# Patient Record
Sex: Female | Born: 2000
Health system: Southern US, Community
[De-identification: ages and names within clinical notes are randomized; demographics above are authoritative.]

---
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
## 2020-08-02 IMAGING — DX DG FOOT COMPLETE 3+V*L*
3 series · 3 of 3 positions shown · non-contrast
Comparison: None.

CLINICAL DATA: Fall playing basketball with left foot pain

EXAM:
LEFT FOOT - COMPLETE 3+ VIEW

[foot ap]
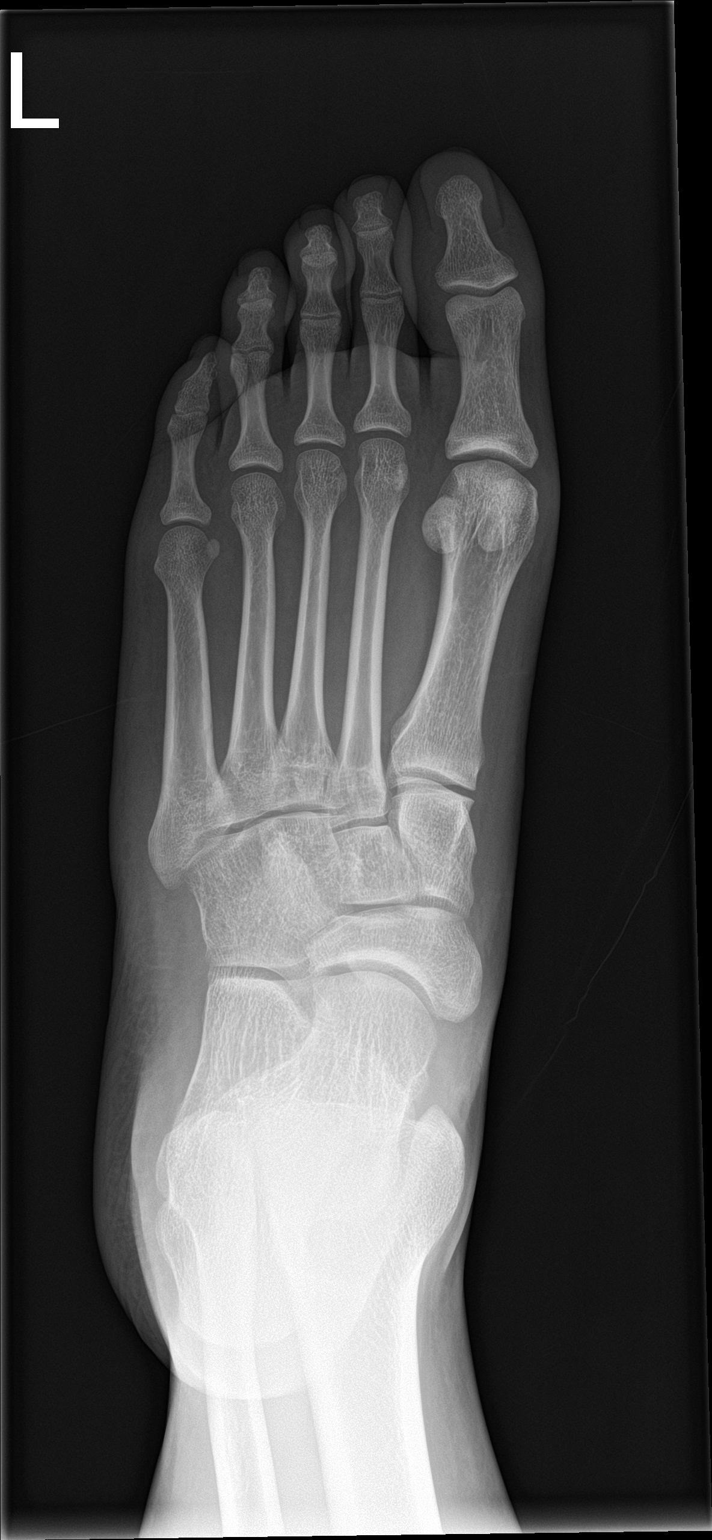

[foot obl]
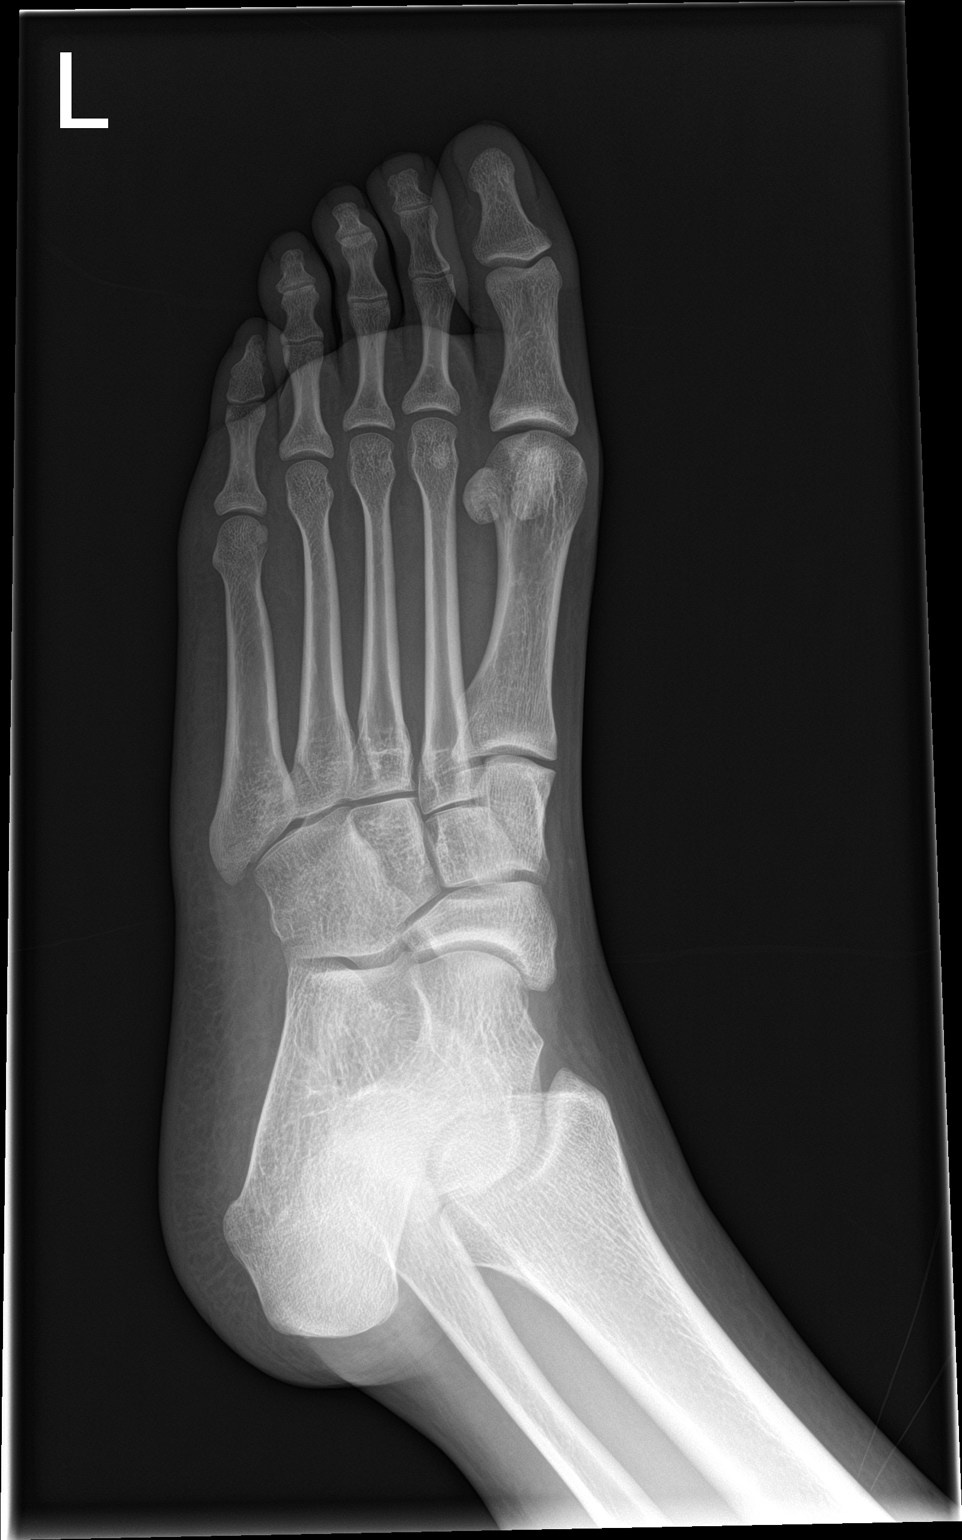

[foot lat]
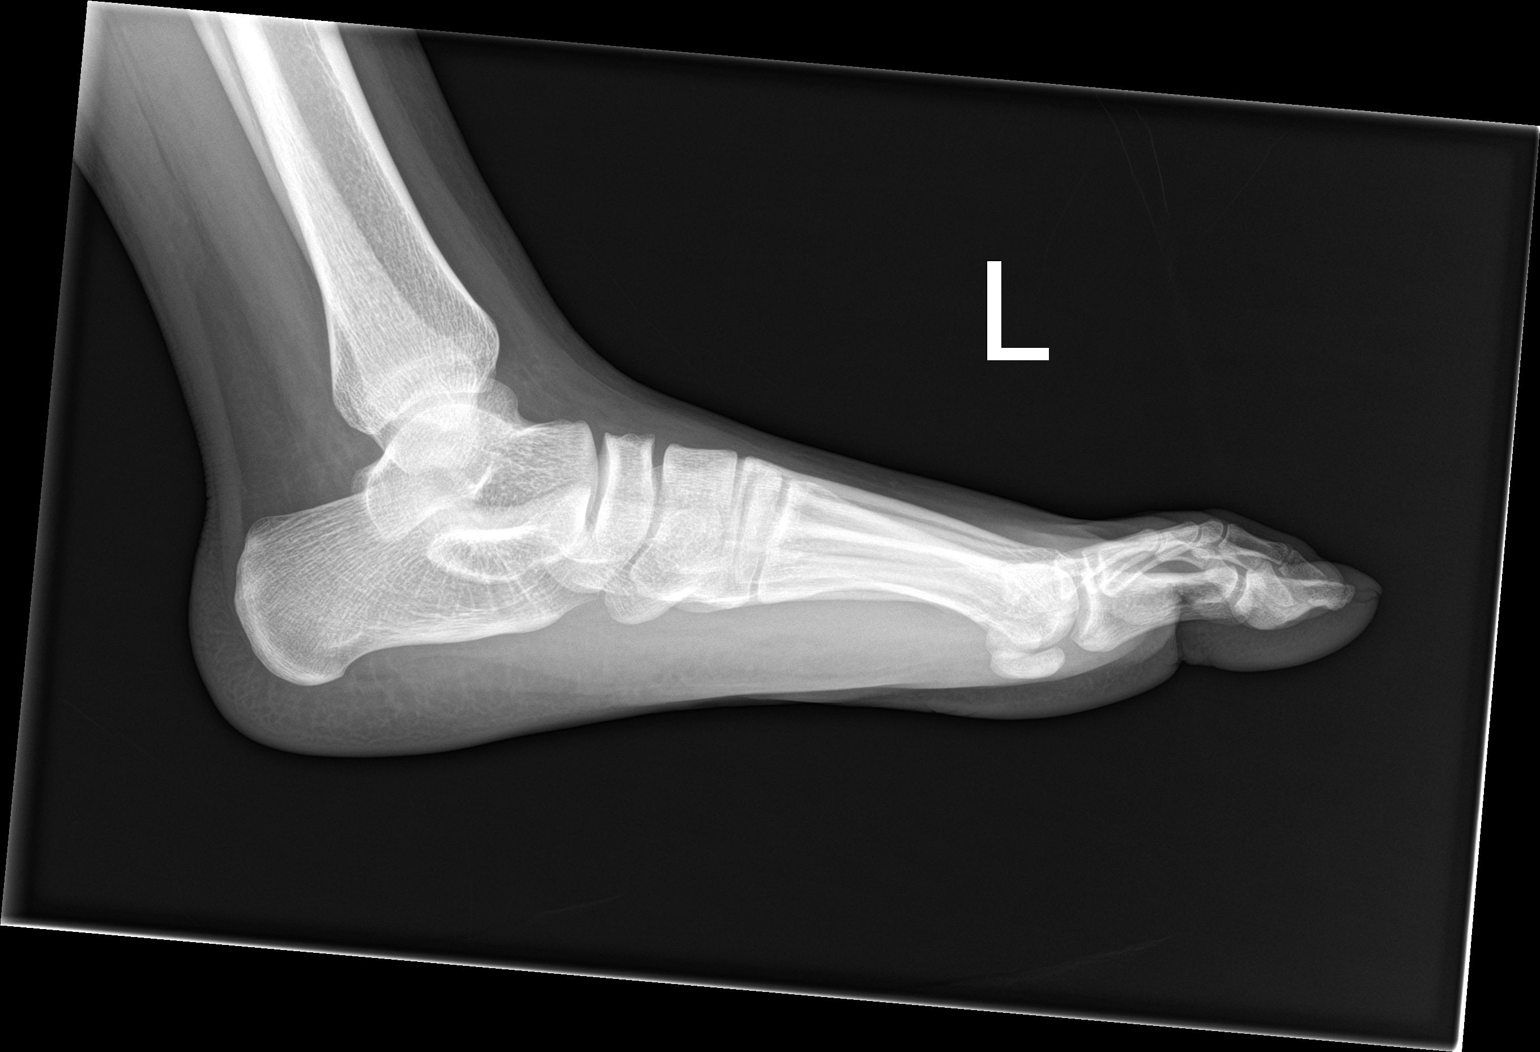

[3 of 3 positions shown; findings below may reference images not displayed]

FINDINGS: Subtle lucency through the base of the fifth metatarsal may reflect
a nondisplaced fracture with minimal overlying soft tissue swelling.
No other acute fracture or traumatic malalignment is evident. There
is an appearance of pes planus though mid and hindfoot alignment is
incompletely assessed on nonweightbearing radiographs. Mild soft
tissue swelling over the midfoot. No subcutaneous gas or foreign
body
IMPRESSION: Subtle lucency through the base of the fifth metatarsal may reflect
a nondisplaced fracture with minimal overlying soft tissue swelling.
Correlate for point tenderness.

No other acute fracture or traumatic malalignment.

Appearance of pes planus though mid and hindfoot alignment is
incompletely assessed on nonweightbearing views.

## 2022-10-03 ENCOUNTER — Emergency Department (HOSPITAL_COMMUNITY)
Admission: EM | Admit: 2022-10-03 | Discharge: 2022-10-04 | Discharge status: Against Medical Advice | Payer: No Typology Code available for payment source | Attending: Emergency Medicine | Admitting: Emergency Medicine

## 2022-10-03 ENCOUNTER — Other Ambulatory Visit: Payer: Self-pay

## 2022-10-03 DIAGNOSIS — R1033 Periumbilical pain: Secondary | ICD-10-CM | POA: Insufficient documentation

## 2022-10-03 DIAGNOSIS — Z5321 Procedure and treatment not carried out due to patient leaving prior to being seen by health care provider: Secondary | ICD-10-CM | POA: Insufficient documentation

## 2022-10-03 DIAGNOSIS — R112 Nausea with vomiting, unspecified: Secondary | ICD-10-CM | POA: Insufficient documentation

## 2022-10-03 LAB — CBC WITH DIFFERENTIAL/PLATELET
Abs Immature Granulocytes: 0.07 10*3/uL (ref 0.00–0.07)
Basophils Absolute: 0 10*3/uL (ref 0.0–0.1)
Basophils Relative: 0 %
Eosinophils Absolute: 0 10*3/uL (ref 0.0–0.5)
Eosinophils Relative: 0 %
HCT: 38.8 % (ref 36.0–46.0)
Hemoglobin: 12.4 g/dL (ref 12.0–15.0)
Immature Granulocytes: 1 %
Lymphocytes Relative: 7 %
Lymphs Abs: 0.9 10*3/uL (ref 0.7–4.0)
MCH: 27 pg (ref 26.0–34.0)
MCHC: 32 g/dL (ref 30.0–36.0)
MCV: 84.3 fL (ref 80.0–100.0)
Monocytes Absolute: 0.6 10*3/uL (ref 0.1–1.0)
Monocytes Relative: 5 %
Neutro Abs: 10.3 10*3/uL — ABNORMAL HIGH (ref 1.7–7.7)
Neutrophils Relative %: 87 %
Platelets: 305 10*3/uL (ref 150–400)
RBC: 4.6 MIL/uL (ref 3.87–5.11)
RDW: 14.5 % (ref 11.5–15.5)
WBC: 11.8 10*3/uL — ABNORMAL HIGH (ref 4.0–10.5)
nRBC: 0 % (ref 0.0–0.2)

## 2022-10-03 LAB — URINALYSIS, ROUTINE W REFLEX MICROSCOPIC
Bacteria, UA: NONE SEEN
Bilirubin Urine: NEGATIVE
Glucose, UA: NEGATIVE mg/dL
Hgb urine dipstick: NEGATIVE
Ketones, ur: 80 mg/dL — AB
Leukocytes,Ua: NEGATIVE
Nitrite: NEGATIVE
Protein, ur: 30 mg/dL — AB
Specific Gravity, Urine: 1.021 (ref 1.005–1.030)
pH: 6 (ref 5.0–8.0)

## 2022-10-03 LAB — COMPREHENSIVE METABOLIC PANEL
ALT: 13 U/L (ref 0–44)
AST: 18 U/L (ref 15–41)
Albumin: 4.4 g/dL (ref 3.5–5.0)
Alkaline Phosphatase: 57 U/L (ref 38–126)
Anion gap: 13 (ref 5–15)
BUN: <5 mg/dL — ABNORMAL LOW (ref 6–20)
CO2: 21 mmol/L — ABNORMAL LOW (ref 22–32)
Calcium: 9.6 mg/dL (ref 8.9–10.3)
Chloride: 99 mmol/L (ref 98–111)
Creatinine, Ser: 0.66 mg/dL (ref 0.44–1.00)
GFR, Estimated: >60 mL/min (ref >60)
Glucose, Bld: 94 mg/dL (ref 70–99)
Potassium: 3.3 mmol/L — ABNORMAL LOW (ref 3.5–5.1)
Sodium: 133 mmol/L — ABNORMAL LOW (ref 135–145)
Total Bilirubin: 1.1 mg/dL (ref 0.3–1.2)
Total Protein: 8 g/dL (ref 6.5–8.1)

## 2022-10-03 LAB — I-STAT BETA HCG BLOOD, ED (MC, WL, AP ONLY): I-stat hCG, quantitative: <5 m[IU]/mL (ref <5)

## 2022-10-03 LAB — LIPASE, BLOOD: Lipase: 31 U/L (ref 11–51)

## 2022-10-03 MED ORDER — OXYCODONE-ACETAMINOPHEN 5-325 MG PO TABS
1.0000 | ORAL_TABLET | Freq: Once | ORAL | Status: AC
  Administered 2022-10-03: 1 via ORAL
  Filled 2022-10-03: qty 1

## 2022-10-03 MED ORDER — ONDANSETRON 4 MG PO TBDP
8.0000 mg | ORAL_TABLET | Freq: Once | ORAL | Status: AC
  Administered 2022-10-03: 8 mg via ORAL
  Filled 2022-10-03: qty 2

## 2022-10-03 NOTE — ED Provider Triage Note (Signed)
Emergency Medicine Provider Triage Evaluation Note  Kaitlin Ball , a 22 y.o. female  was evaluated in triage.  Pt complains of periumbilical abd pain with nausea and vomiting onset today.  Review of Systems  Positive: Abd pain, n/v Negative: Changes in bowel or bladder habits  Physical Exam  There were no vitals taken for this visit. Gen:   Awake, no distress   Resp:  Normal effort  MSK:   no cva tenderness  Medical Decision Making  Medically screening exam initiated at 8:39 PM.  Appropriate orders placed.  Kaitlin Ball was informed that the remainder of the evaluation will be completed by another provider, this initial triage assessment does not replace that evaluation, and the importance of remaining in the ED until their evaluation is complete.     Jeannie Fend, PA-C 10/03/22 2100

## 2022-10-03 NOTE — ED Triage Notes (Signed)
Presents with N/V starting this am, chills, fever. Reports abd pain earlier, but not currently. LMP: 3 days ago. Denies dysuria and no blood in vomit/stool.

## 2022-10-04 NOTE — ED Notes (Signed)
X2 no response for vitals
